# Patient Record
Sex: Male | Born: 1995 | Race: White | Hispanic: No | Marital: Single | Smoking: Current every day smoker
Health system: Southern US, Community
[De-identification: ages and names within clinical notes are randomized; demographics above are authoritative.]

---

## 2020-12-16 ENCOUNTER — Encounter: Payer: Self-pay | Admitting: Internal Medicine

## 2020-12-16 ENCOUNTER — Other Ambulatory Visit: Payer: Self-pay

## 2020-12-16 ENCOUNTER — Ambulatory Visit (INDEPENDENT_AMBULATORY_CARE_PROVIDER_SITE_OTHER): Payer: BC Managed Care – PPO | Admitting: Internal Medicine

## 2020-12-16 VITALS — BP 143/82 | HR 82 | Temp 98.0°F | Ht 73.0 in | Wt 158.0 lb

## 2020-12-16 DIAGNOSIS — Z23 Encounter for immunization: Secondary | ICD-10-CM | POA: Diagnosis not present

## 2020-12-16 DIAGNOSIS — B182 Chronic viral hepatitis C: Secondary | ICD-10-CM | POA: Diagnosis not present

## 2020-12-16 NOTE — Progress Notes (Signed)
[facility]  Reason for Consult: chronic hep C Referring Provider: Elray Buba, MD from [facility]    There are no problems to display for this patient.     HPI: Roger Robinson is a 25 y.o. 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 He never was dx'ed or told about hep c prior to 11/2020 He never drinks alcohol He is doing well with current rehab program; he is not sure how long he'll take vivitrol for  Social: Patient lives alone; no relatives in [place] Patient will get back to work in a few work. Patient is a Teaching laboratory technician No etoh use Smokes cigarette; 4 years 1/2 ppd He endorses vaping  No marijuana   Cirrhosis/extraGI manifestation Besides the episode of yellowing skin/flu like illness 02/2020 he had felt well otherwise No hx abd ascites/distension or need of paracentesis; no chronic fatigue; weight loss; hx GIB No recurrent rash, joint pain No admission kidney/lung disease otherwise  Meds: He just finished 14 days augmentin for a right face abscess Atomoxetine escitalopram Ibuprofen prn Trazodone vivitrol injection q4weeks  problems No other medical problem otherwise No hx reflux/heartburn. No consumption of any otc acid blocker  Currently he is at healthy baseline health. No f/c/weight loss, nightsweat. No n/v/diarrhea. Good appetite  Review of Systems: ROS Other ros negative     No past medical history on file.  Social History   Tobacco Use  .  Smoking status: Current Every Day Smoker    Packs/day: 0.50    Years: 4.00    Pack years: 2.00    Types: Cigarettes  . Smokeless tobacco: Never Used  Vaping Use  . Vaping Use: Every day  Substance Use Topics  . Alcohol use: Not Currently  . Drug use: Not Currently    Types: IV, Cocaine, Marijuana, "Crack" cocaine, Fentanyl    Comment: last use in Nov 2021    fam hx: Unclear if any hepatitis present    OBJECTIVE: Vitals:   12/16/20 1022  BP: (!) 143/82  Pulse: 82  Temp: 98 F (36.7 C)  Weight: 158 lb (71.7 kg)  Height: 6\' 1"  (1.854 m)   Body mass index is 20.85 kg/m.   Physical Exam Well appearing, conversant, no distress Heent: atraumatic; right lower facial cellulitis/abscess per patient had resolved; oropahrynx clear Neck supple cv rrr no mrg Lungs clear abd s/nt Ext no edema Skin no rash; multiple tatoos Neuro: cn2-12 intact; strength/reflex intact; nonfocal otherwise Psych alert/oriented msk no cva/back tenderness  Lab: osh 11/13/20 Lipid 213/49/145/89 Cbc 7.7/14.9/307 Cr 0.8; lft 74/191/88/0.6; alb 4.9  Microbiology:  Serology: osh 11/13/20 rpr nr, urine gc/chlam negative hiv serology nr; rpr nr; hep c ab reactive; hep a ab nonreactive  Imaging:   Assessment/plan: #chronic hep c  Prior treatment: none GT: unknown at this time Evidence of cirrhosis: no Interested in treatment  Potential DDI: none  Will need to ascertain chronicity of hep c and active infection   -  Labs: hep c genotype/pcr -Imaging: elastography [place] ordered -follow up: 4 weeks when results available -Meds planned: mavyret of 8 weeks; no ddi  -discussed natural progression of hep c, transmission (avoid sharing personal hygiene equipment) -discussed avoid toxin like etoh and excessive acetamaminphen (no more than 2 gram a day) -discussed healthy life style and good glucose control -discussed avoiding eating raw sea food -discussed we can treat hep c but can be  reinfected -discussed hepatitis coinfection and vaccination  #other hepatitis Patient interested in hepatitis b vaccine as well. I think this is reasonable -start hep b vaccine series today 12/16/20  ---- Addendum We only heplisav here and separate hep a vaccine --> will give hep b vaccine only then  Problem List Items Addressed This Visit   None   Visit Diagnoses    Chronic hepatitis C without hepatic coma (HCC)    -  Primary   Relevant Orders   Hepatitis C RNA quantitative   Hepatitis C genotype   Protime-INR   US ABDOMEN COMPLETE W/ELASTOGRAPHY       I am having Zack Seal maintain his traZODone, escitalopram, atomoxetine, amoxicillin-clavulanate, and ibuprofen.     Follow-up: No follow-ups on file.  Raymondo Band, MD [facility] -- -- pager   [phone] cell 12/16/2020, 10:35 AM

## 2020-12-16 NOTE — Patient Instructions (Addendum)
You likely have chronic hep c  As 1 in 4 people clears hep c infection, will test for its active presence today  Will also order a liver ultrasound to help determine length of treatment  Once these are available, please see me 2 weeks after they are done to discuss treatment  Will also start hep a/b vaccination today. You can come in for nurse administration of these shots, and not needing to see me.

## 2020-12-20 LAB — HEPATITIS C RNA QUANTITATIVE
HCV Quantitative Log: 6.06 log IU/mL — ABNORMAL HIGH
HCV RNA, PCR, QN: 1150000 IU/mL — ABNORMAL HIGH

## 2020-12-20 LAB — PROTIME-INR
INR: 1
Prothrombin Time: 10.3 s (ref 9.0–11.5)

## 2020-12-20 LAB — HEPATITIS C GENOTYPE: HCV Genotype: 3

## 2020-12-22 ENCOUNTER — Ambulatory Visit (HOSPITAL_COMMUNITY): Payer: BC Managed Care – PPO

## 2020-12-26 ENCOUNTER — Ambulatory Visit (HOSPITAL_COMMUNITY): Payer: BC Managed Care – PPO

## 2021-01-16 ENCOUNTER — Emergency Department (HOSPITAL_COMMUNITY): Payer: BC Managed Care – PPO

## 2021-01-16 ENCOUNTER — Emergency Department (HOSPITAL_COMMUNITY)
Admission: EM | Admit: 2021-01-16 | Discharge: 2021-01-19 | Disposition: A | Discharge status: Home / Self Care | Payer: BC Managed Care – PPO | Attending: Emergency Medicine | Admitting: Emergency Medicine

## 2021-01-16 ENCOUNTER — Other Ambulatory Visit: Payer: Self-pay

## 2021-01-16 ENCOUNTER — Encounter (HOSPITAL_COMMUNITY): Payer: Self-pay | Admitting: Student

## 2021-01-16 DIAGNOSIS — W228XXA Striking against or struck by other objects, initial encounter: Secondary | ICD-10-CM | POA: Insufficient documentation

## 2021-01-16 DIAGNOSIS — Z20822 Contact with and (suspected) exposure to covid-19: Secondary | ICD-10-CM | POA: Insufficient documentation

## 2021-01-16 DIAGNOSIS — T50902A Poisoning by unspecified drugs, medicaments and biological substances, intentional self-harm, initial encounter: Secondary | ICD-10-CM | POA: Diagnosis not present

## 2021-01-16 DIAGNOSIS — R42 Dizziness and giddiness: Secondary | ICD-10-CM | POA: Diagnosis not present

## 2021-01-16 DIAGNOSIS — F339 Major depressive disorder, recurrent, unspecified: Secondary | ICD-10-CM | POA: Diagnosis not present

## 2021-01-16 DIAGNOSIS — R000 Tachycardia, unspecified: Secondary | ICD-10-CM | POA: Diagnosis not present

## 2021-01-16 DIAGNOSIS — R45851 Suicidal ideations: Secondary | ICD-10-CM | POA: Insufficient documentation

## 2021-01-16 DIAGNOSIS — F17210 Nicotine dependence, cigarettes, uncomplicated: Secondary | ICD-10-CM | POA: Diagnosis not present

## 2021-01-16 DIAGNOSIS — Z046 Encounter for general psychiatric examination, requested by authority: Secondary | ICD-10-CM | POA: Diagnosis present

## 2021-01-16 LAB — CBC
HCT: 45.8 % (ref 39.0–52.0)
Hemoglobin: 15.1 g/dL (ref 13.0–17.0)
MCH: 29 pg (ref 26.0–34.0)
MCHC: 33 g/dL (ref 30.0–36.0)
MCV: 88.1 fL (ref 80.0–100.0)
Platelets: 333 10*3/uL (ref 150–400)
RBC: 5.2 MIL/uL (ref 4.22–5.81)
RDW: 12.8 % (ref 11.5–15.5)
WBC: 9.7 10*3/uL (ref 4.0–10.5)
nRBC: 0 % (ref 0.0–0.2)

## 2021-01-16 LAB — COMPREHENSIVE METABOLIC PANEL
ALT: 32 U/L (ref 0–44)
AST: 31 U/L (ref 15–41)
Albumin: 4.3 g/dL (ref 3.5–5.0)
Alkaline Phosphatase: 66 U/L (ref 38–126)
Anion gap: 12 (ref 5–15)
BUN: 9 mg/dL (ref 6–20)
CO2: 25 mmol/L (ref 22–32)
Calcium: 9.9 mg/dL (ref 8.9–10.3)
Chloride: 103 mmol/L (ref 98–111)
Creatinine, Ser: 0.98 mg/dL (ref 0.61–1.24)
GFR, Estimated: >60 mL/min (ref >60)
Glucose, Bld: 90 mg/dL (ref 70–99)
Potassium: 3.4 mmol/L — ABNORMAL LOW (ref 3.5–5.1)
Sodium: 140 mmol/L (ref 135–145)
Total Bilirubin: 0.4 mg/dL (ref 0.3–1.2)
Total Protein: 7.3 g/dL (ref 6.5–8.1)

## 2021-01-16 LAB — SALICYLATE LEVEL: Salicylate Lvl: <7 mg/dL — ABNORMAL LOW (ref 7.0–30.0)

## 2021-01-16 LAB — ACETAMINOPHEN LEVEL: Acetaminophen (Tylenol), Serum: <10 ug/mL — ABNORMAL LOW (ref 10–30)

## 2021-01-16 LAB — MAGNESIUM: Magnesium: 1.9 mg/dL (ref 1.7–2.4)

## 2021-01-16 LAB — ETHANOL: Alcohol, Ethyl (B): <10 mg/dL (ref <10)

## 2021-01-16 MED ORDER — POTASSIUM CHLORIDE CRYS ER 20 MEQ PO TBCR
40.0000 meq | EXTENDED_RELEASE_TABLET | Freq: Once | ORAL | Status: AC
  Administered 2021-01-16: 40 meq via ORAL
  Filled 2021-01-16: qty 2

## 2021-01-16 MED ORDER — SODIUM CHLORIDE 0.9 % IV BOLUS
1000.0000 mL | Freq: Once | INTRAVENOUS | Status: AC
  Administered 2021-01-16: 1000 mL via INTRAVENOUS

## 2021-01-16 NOTE — ED Provider Notes (Signed)
[facility] EMERGENCY DEPARTMENT Provider Note   CSN: [id number] Arrival date & time: 01/16/21  2134     History Chief Complaint  Patient presents with  . Suicide Attempt    Roger Robinson is a 25 y.o. male with a hx of tobacco & methamphetamine use who [facility] of attempted overdose & near syncope. Patient states he has had increased anxiety/jitteriness today, he has been feeling depressed, thoughts of suicide. Today he states that he took approximately 20-25 tablets of Unisom (25 mg tablets) around 20:30. He denies this being a suicide attempt but was stress induced. He noted met use at 1900 to triage, denied this to me. Approximately 10-15 minutes following ingestion he felt lightheaded and fell backwards hitting his head, no LOC. No chest pain/dyspnea. No pain at present. States he still feels a little jittery/anxious. Denies HI or hallucinations.    HPI     History reviewed. No pertinent past medical history.  There are no problems to display for this patient.   History reviewed. No pertinent surgical history.     History reviewed. No pertinent family history.  Social History   Tobacco Use  . Smoking status: Current Every Day Smoker    Packs/day: 0.50    Years: 4.00    Pack years: 2.00    Types: Cigarettes  . Smokeless tobacco: Never Used  Vaping Use  . Vaping Use: Every day  Substance Use Topics  . Alcohol use: Not Currently  . Drug use: Not Currently    Types: IV, Cocaine, Marijuana, "Crack" cocaine, Fentanyl    Comment: last use in Nov 2021    Home Medications Prior to Admission medications   Medication Sig Start Date End Date Taking? Authorizing Provider  atomoxetine (STRATTERA) 60 MG capsule Take 60 mg by mouth every morning. 12/11/20   [provider]  escitalopram (LEXAPRO) 10 MG tablet Take 5 mg by mouth daily. 12/11/20   [provider]  ibuprofen (ADVIL) 600 MG tablet  Take 600 mg by mouth every 6 (six) hours as needed.    [provider]  traZODone (DESYREL) 50 MG tablet Take 50 mg by mouth as needed. 12/11/20   [provider]    Allergies    Patient has no known allergies.  Review of Systems   Review of Systems  Constitutional: Negative for fever.  Eyes: Negative for visual disturbance.  Respiratory: Negative for shortness of breath.   Cardiovascular: Negative for chest pain.  Gastrointestinal: Negative for abdominal pain and vomiting.  Neurological: Positive for syncope (near, no full LOC) and light-headedness.  Psychiatric/Behavioral: Positive for suicidal ideas. Negative for hallucinations. The patient is nervous/anxious.   All other systems reviewed and are negative.   Physical Exam Updated Vital Signs BP 137/78   Pulse (!) 120   Temp 98.1 F (36.7 C) (Oral)   Resp 18   SpO2 97%   Physical Exam Vitals and nursing note reviewed.  Constitutional:      General: He is not in acute distress.    Appearance: He is well-developed. He is not toxic-appearing.  HENT:     Head: Normocephalic and atraumatic.     Comments: No racoon eyes or battle sign.     Ears:     Comments: No hemotympanum.  Eyes:     General:        Right eye: No discharge.        Left eye: No discharge.  Extraocular Movements: Extraocular movements intact.     Conjunctiva/sclera: Conjunctivae normal.     Pupils: Pupils are equal, round, and reactive to light.     Comments: Pupils mildly dilated but reactive.   Cardiovascular:     Rate and Rhythm: Regular rhythm. Tachycardia present.     Comments: 2+ symmetric radial/DP pulses.  Pulmonary:     Effort: Pulmonary effort is normal. No respiratory distress.     Breath sounds: Normal breath sounds. No wheezing, rhonchi or rales.  Chest:     Chest wall: No tenderness.  Abdominal:     General: There is no distension.     Palpations: Abdomen is soft.     Tenderness: There is no abdominal tenderness.  There is no guarding or rebound.  Musculoskeletal:     Cervical back: Normal range of motion and neck supple. No tenderness.     Comments: UE/LEs: No significant obvious deformities. Actively moving at all joints x 4. No point/focal bony tenderness.  Back: No midline tenderness.   Skin:    General: Skin is warm and dry.     Findings: No rash.  Neurological:     Mental Status: He is alert.     Comments: Clear speech. CN III-XII grossly intact. Sensation grossly intact to bilateral upper/lower extremities. 5/5 symmetric grip strength & strength with plantar/dorsiflexion bilaterally.   Psychiatric:        Thought Content: Thought content includes suicidal ideation.     ED Results / Procedures / Treatments   Labs (all labs ordered are listed, but only abnormal results are displayed) Labs Reviewed  COMPREHENSIVE METABOLIC PANEL - Abnormal; Notable for the following components:      Result Value   Potassium 3.4 (*)    All other components within normal limits  SALICYLATE LEVEL - Abnormal; Notable for the following components:   Salicylate Lvl <2.0 (*)    All other components within normal limits  ACETAMINOPHEN LEVEL - Abnormal; Notable for the following components:   Acetaminophen (Tylenol), Serum <10 (*)    All other components within normal limits  ETHANOL  CBC  RAPID URINE DRUG SCREEN, HOSP PERFORMED    EKG EKG Interpretation  Date/Time:  Friday January 16 2021 23:42:16 EST Ventricular Rate:  102 PR Interval:  164 QRS Duration: 102 QT Interval:  394 QTC Calculation: 513 R Axis:   95 Text Interpretation: Sinus tachycardia Possible Left atrial enlargement Rightward axis Borderline ECG Confirmed by Veryl Speak [phone]) on 01/16/2021 11:57:09 PM   Radiology No results found.  Procedures Procedures   Medications Ordered in ED Medications - No data to display  ED Course  I have reviewed the triage vital signs and the nursing notes.  Pertinent labs & imaging results  that were available during my care of the patient were reviewed by me and considered in my medical decision making (see chart for details).    MDM Rules/Calculators/A&P                         Patient presents to the ED status post overdose on Unisom.  Patient is nontoxic, his vitals are notable for initial tachycardia. No obvious signs of trauma on exam in relation to his fall. I discussed the case with poison control, they have recommended observing for anticholinergic side effects, recommend fluids and benzodiazepines for tachycardia or potential seizures, look for prolongation of the QRS/QTc on EKG, will need 6 hours of observation from time of ingestion.  Additional history obtained:  Additional history obtained from chart review & nursing note review.   Initial EKG with prolonged QTC, QRS is within normal limits.  Lab Tests:  I Ordered, reviewed, and interpreted labs, which included:  CBC, CMP, magnesium, ethanol level, acetaminophen level, salicylate level, UDS, Covid testing: Fairly unremarkable, mildly hypokalemic at 3.4.  Imaging Studies ordered:  CT head obtained, I independently reviewed, formal radiology impression shows: no acute process.   ED Course:  Given patient's somewhat prolonged QTC he was given IV potassium and magnesium as well as oral potassium which he tolerated without difficulty.  His repeat EKG shows a QTC of less than 500.  He is symptomatically feeling well, no current complaints, his heart rate has normalized.  I rediscussed the case with poison control at 4AM-patient can be medically cleared.  Patient is medically cleared at this time.  Consult placed to behavioral health for further assessment.  Disposition per [facility].   Portions of this note were generated with Lobbyist. Dictation errors may occur despite best attempts at proofreading.   Final Clinical Impression(s) / ED Diagnoses Final diagnoses:  Intentional drug overdose, initial  encounter [facility])    Rx / DC Orders ED Discharge Orders    None       Amaryllis Dyke, PA-C 01/17/21 7703    Veryl Speak, MD 01/17/21 [phone]

## 2021-01-16 NOTE — ED Notes (Signed)
Patient transported to CT scan . 

## 2021-01-16 NOTE — ED Notes (Signed)
Personal belongings inventoried and stored at locker#3 at [facility].

## 2021-01-16 NOTE — ED Notes (Signed)
RN from [facility] called and said that if pt's UDS is positive he cannot return to facility

## 2021-01-16 NOTE — ED Triage Notes (Addendum)
Pt presents to ED [facility] from rehab. Pt had fall today after feeling dizzy. Fall was aprox 10-37m after attempted suicide and took 35 - 25mg  unisom around 2030, and meth use at 73. Abrasion to back of head, no LOC, no blood thinners. Ems  -  134/84 HR - 116 96% RA

## 2021-01-17 LAB — RAPID URINE DRUG SCREEN, HOSP PERFORMED
Amphetamines: NOT DETECTED
Barbiturates: NOT DETECTED
Benzodiazepines: NOT DETECTED
Cocaine: NOT DETECTED
Opiates: NOT DETECTED
Tetrahydrocannabinol: NOT DETECTED

## 2021-01-17 LAB — RESP PANEL BY RT-PCR (FLU A&B, COVID) ARPGX2
Influenza A by PCR: NEGATIVE
Influenza B by PCR: NEGATIVE
SARS Coronavirus 2 by RT PCR: NEGATIVE

## 2021-01-17 MED ORDER — ALUM & MAG HYDROXIDE-SIMETH 200-200-20 MG/5ML PO SUSP: 30.0000 mL | Freq: Four times a day (QID) | ORAL | Status: DC | PRN

## 2021-01-17 MED ORDER — ZOLPIDEM TARTRATE 5 MG PO TABS: 5.0000 mg | ORAL_TABLET | Freq: Every evening | ORAL | Status: DC | PRN

## 2021-01-17 MED ORDER — NICOTINE 21 MG/24HR TD PT24
21.0000 mg | MEDICATED_PATCH | Freq: Every day | TRANSDERMAL | Status: DC
Start: 2021-01-17 — End: 2021-01-19

## 2021-01-17 MED ORDER — MAGNESIUM SULFATE IN D5W 1-5 GM/100ML-% IV SOLN
1.0000 g | Freq: Once | INTRAVENOUS | Status: AC
  Administered 2021-01-17: 1 g via INTRAVENOUS
  Filled 2021-01-17: qty 100

## 2021-01-17 MED ORDER — POTASSIUM CHLORIDE 10 MEQ/100ML IV SOLN
10.0000 meq | Freq: Once | INTRAVENOUS | Status: AC
  Administered 2021-01-17: 10 meq via INTRAVENOUS
  Filled 2021-01-17: qty 100

## 2021-01-17 MED ORDER — ACETAMINOPHEN 325 MG PO TABS: 650.0000 mg | ORAL_TABLET | ORAL | Status: DC | PRN

## 2021-01-17 NOTE — BH Assessment (Signed)
TTS spoke with Nurse Secretary, to notify RN to put pt in a private room to complete TTS assessment.  Clinician to call the cart.

## 2021-01-17 NOTE — ED Provider Notes (Signed)
Patient recommended for inpatient admission according to behavioral health.  Patient uncooperative with this and placed under IVC for his safety   Lorre Nick, MD 01/17/21 1147

## 2021-01-17 NOTE — ED Notes (Signed)
TTS video interview in progress .  

## 2021-01-17 NOTE — ED Notes (Signed)
Pt awakened asking this RN "why am I still here". This RN explained to pt that [facility] has recommended inpatient treatment due to most recent suicide attempt. Pt states, "I already have inpatient treatment, and I'm leaving, just watch me I'll sneak away when your not looking". Instructed pt back to bed. Dr. Freida Busman notified. IVC paperwork being drawn. Pt now in bed resting.

## 2021-01-17 NOTE — Progress Notes (Signed)
Per Melbourne Abts PA-C, patient meets criteria for inpatient treatment. There are no available beds at [facility] today. CSW faxed referrals to the following facilities for review:  [facility] [place]  TTS will continue to seek bed placement.   Trula Slade, MSW, LCSW Clinical Social Worker 01/17/2021 11:26 AM

## 2021-01-17 NOTE — BH Assessment (Signed)
Comprehensive Clinical Assessment (CCA) Note  01/17/2021 Roger Robinson [id number]  Chief Complaint:  Chief Complaint  Patient presents with  . Suicide Attempt   Visit Diagnosis:  F33.9 Major depressive disorder, Recurrent episode, Unspecified  Roger Robinson is a 25 years old single male who presents voluntarily to [facility] ED via EMS and accompanied by his mother, Roger Robinson [phone], TTS counselor unable to contact, due to mailbox full.    Pt reported to EMS ' fall was aprox 1--23m after attempted suicide and took 25-25mg . Pt reports that he have been experiencing panic attacks, "I have had five, and they come out of no where".  Pt report that he woke up and couldn't move his legs, fell to the ground.  Clinician asked patient what caused the fall.   Pt denies suicide ideation; P admitted that he had thoughts of past suicide in January 2020 didn't explained means or plan.  Pt denied homicidal ideation or history of violence.  Pt denies history of auditory or visual hallucinations.  Pt admits to brief paranoia, would not go in detail.  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  Pt identifies his primary stressors as people and places.  Pt reports that he is working and he has four children.  Pt reports that he currently living with his mother.  Pt reports that his father and brother has a history of substance use and mental illness  Pt denies any history of abuse or trauma.  Pt reports that he have a two court dates scheduled for February and March 2022.  Pt says he is currently receiving weekly outpatient therapy from [facility] at [facility].  Pt reported that he meets with a Brooks Sailors, in [facility] for therapy.  Pt reports he takes medications as prescribe and denies previous inpatient psychiatric hospitalization.  Pt is dressed in scrubs, alert, oriented x 3 with agitated speech and restless motor behavior.  Eye  contact is starring.  Pt mood is agitated, and affect is anxious.  Thought process is confused and scatter.  Pt insight is shallow and judgement is impaired.  There is no indication Pt is currently responding to internal stimuli or experiencing delusional thought content.  Pt was guarded throughout assessment.                    Deposition Melbourne Abts PA-C, Pt meets inpatient criteria, no beds available at [facility].  Recommends observation and to be reassessed by psychiatry, also recommends IVC.  Dispositon Social Worker will secure placement in the AM.  Disposition was discussed with [name], Charity fundraiser.    CCA Screening, Triage and Referral (STR)  Patient Reported Information How did you hear about [place]? No data recorded Referral name: No data recorded Referral phone number: No data recorded  Whom do you see for routine medical problems? I don't have a doctor  Practice/Facility Name: No data recorded Practice/Facility Phone Number: No data recorded Name of Contact: No data recorded Contact Number: No data recorded Contact Fax Number: No data recorded Prescriber Name: No data recorded Prescriber Address (if known): No data recorded  What Is the Reason for Your Visit/Call Today? Substance Induce  How Long Has This Been Causing You Problems? <Week  What Do You Feel Would Help You the Most Today? -- (UTA)   Have You Recently Been in Any Inpatient Treatment (Hospital/Detox/Crisis Center/28-Day Program)? Yes  Name[facility]Location of [facility]/Hospital:[facility]  How Long Were You There? November 10, 2020  When Were You Discharged? 01/17/2021   Have You Ever Received Services From [facility] Before? No  Who Do You See at [facility]? No data recorded  Have You Recently Had Any Thoughts About Hurting Yourself? No  Are You Planning to Commit Suicide/Harm Yourself At This time? No (Pt reports that he thought of suicide ideation in 2020, refused to share mean and plan.)   Have  you Recently Had Thoughts About Hurting Someone Karolee Ohs? No  Explanation: No data recorded  Have You Used Any Alcohol or Drugs in the Past 24 Hours? No (Pt denied using substance.)  How Long Ago Did You Use Drugs or Alcohol? No data recorded What Did You Use and How Much? No data recorded  Do You Currently Have a Therapist/Psychiatrist? Yes  Name of Therapist/Psychiatrist: [facility] that [facility], [place]   Have You Been Recently Discharged From Any Office Practice or Programs? Yes  Explanation of Discharge From Practice/Program: Fall was aprox 10-30m after attempted suicide and took 59 - 25mg  unisom around 2030, and meth use at 41.     CCA Screening Triage Referral Assessment Type of Contact: Tele-Assessment  Is this Initial or Reassessment? Initial Assessment  Date Telepsych consult ordered in [facility]:  01/17/2021  Time Telepsych consult ordered in [facility]:  No data recorded  Patient Reported Information Reviewed? Yes  Patient Left Without Being Seen? No data recorded Reason for Not Completing Assessment: No data recorded  Collateral Involvement: 03/17/2021 [phone], unable to speak  or leave a hippa voice mail, her voice mail full.   Does Patient Have a [phone] Guardian? No data recorded Name and Contact of Legal Guardian: No data recorded If Minor and Not Living with Parent(s), Who has Custody? n/a  Is CPS involved or ever been involved? Never  Is APS involved or ever been involved? No data recorded  Patient Determined To Be At Risk for Harm To Self or Others Based on Review of Patient Reported Information or Presenting Complaint? Yes, for Self-Harm (Pt denies self harm)  Method: No data recorded Availability of Means: No data recorded Intent: No data recorded Notification Required: No data recorded Additional Information for Danger to Others Potential: No data recorded Additional Comments for Danger to Others Potential: No data  recorded Are There Guns or Other Weapons in Your Home? No data recorded Types of Guns/Weapons: No data recorded Are These Weapons Safely Secured?                            No data recorded Who Could Verify You Are Able To Have These Secured: No data recorded Do You Have any Outstanding Charges, Pending Court Dates, Parole/Probation? No data recorded Contacted To Inform of Risk of Harm To Self or Others: Law Enforcement   Location of Assessment: [facility] ED   Does Patient Present under Involuntary Commitment? No  IVC Papers Initial File Date: No data recorded  [facility] of Residence: [place]   Patient Currently Receiving the Following Services: IOP (Intensive Outpatient Program)   Determination of Need: No data recorded  Options For Referral: -- (UTA)     CCA Biopsychosocial Intake/Chief Complaint:  SI  Current Symptoms/Problems: irritable, fatigue,   Patient Reported Schizophrenia/Schizoaffective Diagnosis in Past: No   Strengths: UTA  Preferences: UTA  Abilities: UTA   Type of Services Patient Feels are Needed: UTA   Initial  Clinical Notes/Concerns: UTA   Mental Health Symptoms Depression:  No data recorded  Duration of Depressive symptoms: Greater than two weeks   Mania:  Change in energy/activity; Irritability; Recklessness   Anxiety:   Fatigue; Irritability; Restlessness   Psychosis:  None   Duration of Psychotic symptoms: No data recorded  Trauma:  None   Obsessions:  Disrupts routine/functioning; Poor insight; Recurrent & persistent thoughts/impulses/images   Compulsions:  Repeated behaviors/mental acts; Disrupts with routine/functioning; Poor Insight   Inattention:  Does not seem to listen   Hyperactivity/Impulsivity:  N/A   Oppositional/Defiant Behaviors:  Angry; Argumentative; Defies rules; Easily annoyed   Emotional Irregularity:  Transient, stress-related paranoia/disassociation   Other Mood/Personality Symptoms:  UTA    Mental Status  Exam Appearance and self-care  Stature:  Small   Weight:  Average weight   Clothing:  -- (Pt dressed in scrubs)   Grooming:  Normal   Cosmetic use:  Age appropriate   Posture/gait:  Tense   Motor activity:  Agitated   Sensorium  Attention:  Distractible; Confused   Concentration:  Preoccupied; Scattered   Orientation:  Person; Place; Situation   Recall/memory:  Defective in Immediate; Defective in Short-term; Defective in Recent   Affect and Mood  Affect:  Restricted   Mood:  Angry   Relating  Eye contact:  Staring   Facial expression:  Angry   Attitude toward examiner:  Guarded   Thought and Language  Speech flow: Slurred   Thought content:  Suspicious   Preoccupation:  None   Hallucinations:  None   Organization:  No data recorded  Affiliated Computer ServicesExecutive Functions  Fund of Knowledge:  Fair   Intelligence:  Needs investigation   Abstraction:  -- Industrial/product designer(UTA)   Judgement:  Impaired   Reality Testing:  Distorted   Insight:  Shallow   Decision Making:  Impulsive; Confused   Social Functioning  Social Maturity:  Self-centered   Social Judgement:  "Chief of Stafftreet Smart"   Stress  Stressors:  Family conflict; Relationship   Coping Ability:  Contractorxhausted   Skill Deficits:  No data recorded  Supports:  Friends/Service system     Religion: Religion/Spirituality Are You A Religious Person?:  (U'TA) How Might This Affect Treatment?: UTA  Leisure/Recreation: Leisure / Recreation Do You Have Hobbies?:  (UTA)  Exercise/Diet: Exercise/Diet Do You Exercise?:  (UTA) Have You Gained or Lost A Significant Amount of Weight in the Past Six Months?:  (UTA) Do You Follow a Special Diet?:  (Pt reports that he eat well and balance meals.) Do You Have Any Trouble Sleeping?:  (Pt reports that he obtain 6 .5 sleep during the night.)   CCA Employment/Education Employment/Work Situation: Employment / Work Environmental consultantituation Patient's job has been impacted by current illness: No What is  the longest time patient has a held a job?: UTA Where was the patient employed at that time?: [employer] Has patient ever been in the Eli Lilly and Companymilitary?:  Industrial/product designer(UTA)  Education: Education Is Patient Currently Attending School?: Yes School Currently Attending: none Last Grade Completed:  (Pt reports that he did not graduate) Name of High School: UTA Did Garment/textile technologistYou Graduate From [employer] School?:  (UTA) Did You Attend College?:  (UTA) Did You Attend Graduate School?:  (UTA) Did You Have Any Special Interests In School?: UTA Did You Have An Individualized Education Program (IIEP):  (UTA) Did You Have Any Difficulty At School?:  (UTA) Patient's Education Has Been Impacted by Current Illness:  (UTA)   CCA Family/Childhood History Family and Relationship History: Family  history What is your sexual orientation?: UTA Has your sexual activity been affected by drugs, alcohol, medication, or emotional stress?: UTA Does patient have children?: Yes How many children?: 4 How is patient's relationship with their children?: UTA  Childhood History:     Child/Adolescent Assessment:     CCA Substance Use Alcohol/Drug Use: Alcohol / Drug Use Pain Medications: See MRA Prescriptions: See MRA Over the Counter: See MRA History of alcohol / drug use?: Yes Substance #1 Name of Substance 1: Methamphetamines 1 - Age of First Use: 14 1 - Amount (size/oz): 3.5 gram 1 - Frequency: daily 1 - Duration: ongoing 1 - Last Use / Amount: 11/10/2020 1 - Method of Aquiring: UTA 1- Route of Use: UTA                       ASAM's:  Six Dimensions of Multidimensional Assessment  Dimension 1:  Acute Intoxication and/or Withdrawal Potential:      Dimension 2:  Biomedical Conditions and Complications:      Dimension 3:  Emotional, Behavioral, or Cognitive Conditions and Complications:     Dimension 4:  Readiness to Change:     Dimension 5:  Relapse, Continued use, or Continued Problem Potential:     Dimension 6:   Recovery/Living Environment:     ASAM Severity Score:    ASAM Recommended Level of Treatment:     Substance use Disorder (SUD)    Recommendations for Services/Supports/Treatments:    DSM5 Diagnoses: There are no problems to display for this patient.      Referrals to Alternative Service(s): Referred to Alternative Service(s):   Place:   Date:   Time:    Referred to Alternative Service(s):   Place:   Date:   Time:    Referred to Alternative Service(s):   Place:   Date:   Time:    Referred to Alternative Service(s):   Place:   Date:   Time:     Meryle Ready, Counselor

## 2021-01-17 NOTE — ED Notes (Signed)
Belongings inventoried in locker #3-Monique,RN

## 2021-01-18 NOTE — ED Notes (Signed)
Pt ambulated to restroom and back to room. Breakfast at patients bedside.

## 2021-01-18 NOTE — ED Notes (Signed)
Dinner tray delivered.

## 2021-01-18 NOTE — ED Notes (Signed)
Breakfast Ordered 

## 2021-01-18 NOTE — BHH Counselor (Signed)
Reassessment  01/18/21: TTS reassessed patient. When TTS asked patient why he was in the hospital, patient reports he took 1 too many sleeping pills and had no intention of harming himself. Patient reports he lives with his mom and has a history of depression. Patient reports he is compliant with his psych medication, Lexapro. Patient denied SI/HI and any previous attempts. Patient denied AVH. Patient gave TTS permission to speak with his mother, [name].   Collateral: Lisa Hunt(mom)- [phone]: TTS spoke with mom. Mom reports patient had argument with his girl-friend. Patient sent mom a text saying that he did not want to be here anymore. Mom states patient was in rehab and had been clean for 60 days. Mom confirmed history of depression but also reported SA with Methamphetamines and heroin. Mom reports she believed this was a suicide attempt because patient was aware of the current medications that he had already taken.   Disposition: Ginger Organ, NP recommends to continue inpatient treatment.

## 2021-01-18 NOTE — ED Provider Notes (Signed)
Emergency Medicine Observation Re-evaluation Note  Roger Robinson is a 25 y.o. male, seen on rounds today.  Pt initially presented to the ED for complaints of Suicide Attempt Currently, the patient is resting.  Physical Exam  BP 129/73 (BP Location: Left Arm)   Pulse 91   Temp 98.2 F (36.8 C) (Oral)   Resp 17   SpO2 98%  Physical Exam General: calm   Cardiac: warm and well perfused Lungs: even and unlabored Psych: calm  ED Course / MDM  EKG:EKG Interpretation  Date/Time:  Friday January 16 2021 23:42:16 EST Ventricular Rate:  102 PR Interval:  164 QRS Duration: 102 QT Interval:  394 QTC Calculation: 513 R Axis:   95 Text Interpretation: Sinus tachycardia Possible Left atrial enlargement Rightward axis Borderline ECG Confirmed by Geoffery Lyons ([id number]) on 01/16/2021 11:57:09 PM    I have reviewed the labs performed to date as well as medications administered while in observation.  Recent changes in the last 24 hours include IVC per Bruce Donath.  Plan  Current plan is for in patient admission. Patient is under full IVC at this time.   Milagros Loll, MD 01/18/21 1550

## 2021-01-18 NOTE — ED Notes (Signed)
Pt ate all the food provided on his tray. Appetite is good & no complaints from the pt.

## 2021-01-18 NOTE — ED Notes (Signed)
Lunch tray delivered.

## 2021-01-19 LAB — BASIC METABOLIC PANEL
Anion gap: 13 (ref 5–15)
BUN: 18 mg/dL (ref 6–20)
CO2: 23 mmol/L (ref 22–32)
Calcium: 9.3 mg/dL (ref 8.9–10.3)
Chloride: 103 mmol/L (ref 98–111)
Creatinine, Ser: 0.88 mg/dL (ref 0.61–1.24)
GFR, Estimated: >60 mL/min (ref >60)
Glucose, Bld: 177 mg/dL — ABNORMAL HIGH (ref 70–99)
Potassium: 3.6 mmol/L (ref 3.5–5.1)
Sodium: 139 mmol/L (ref 135–145)

## 2021-01-19 NOTE — Progress Notes (Signed)
Patient seen by me as well as TTS counselor Murrell Redden. Patient presented to [facility] on 01/16/21 after overdose on Unisom. Patient reports he had been trying to go to sleep, and when he woke up not feeling right, he called for help. He states understanding that this was a poor choice and says he will not overtake medication again. He denies any SI/HI/AVH. Patient was previously at [facility] for rehab but states he will be discharging home with his mother. TTS counselor Theodoro Grist called the patient's mother for collateral information, and patient's mother denies safety concerns for discharge. I have requested CSW to place outpatient substance use referrals in discharge paperwork. Patient is psych cleared for discharge.

## 2021-01-19 NOTE — Progress Notes (Signed)
Pt has been psychiatrically cleared per Marciano Sequin, NP. Outpatient resources have been placed in pt's discharge summary.     Wells Guiles, MSW, LCSW, LCAS Clinical Social Worker II Disposition CSW [phone]

## 2021-01-19 NOTE — Discharge Instructions (Signed)
Please follow up with one of the following outpatient providers:  [facility] [address] [place], [place] [address] [phone] Private insurance, Medicare A&B, and [facility]  ADS (Alcohol and Drug Services) [address] [place], [place] [address] [phone] Medicaid, Self Pay  [facility] [address] [place], [place] [phone] Medicaid and [facility], Self Pay  [facility] [address] [place], [place] [phone] [facility], and Self Pay  [facility] [address] [place], [place] [address] [phone] or [phone] Private Insurance Only  [facility] [address] G[address]ette Otto, [place] [address] [phone] Medicaid, Medicare, Private Insurance  [facility] at the [facility] [address] [place], [place] [address] [phone] Services are free or reduced  [facility] [address] [phone] Self Pay only, sliding scale  [facility] [address], [place] [address] [phone] Medicaid, Medicare, Private Insurance

## 2021-01-19 NOTE — ED Provider Notes (Addendum)
Emergency Medicine Observation Re-evaluation Note  Roger Robinson is a 25 y.o. male, seen on rounds today.  Pt initially presented to the ED for complaints of Suicide Attempt Currently, the patient is resting with sheet pulled over head.  Physical Exam  BP 120/77 (BP Location: Right Arm)   Pulse 80   Temp 97.9 F (36.6 C) (Oral)   Resp 17   SpO2 97%  Physical Exam General: Resting Cardiac: Normal heart rate Lungs: Normal respiratory rate Psych: Calm  ED Course / MDM  EKG:EKG Interpretation  Date/Time:  Saturday January 17 2021 01:54:20 EST Ventricular Rate:  91 PR Interval:  168 QRS Duration: 102 QT Interval:  404 QTC Calculation: 496 R Axis:   88 Text Interpretation: Normal sinus rhythm Prolonged QT Abnormal ECG NSR, QTc appears appropriate Confirmed by Coralee Pesa [phone]) on 01/18/2021 11:01:54 PM    I have reviewed the labs performed to date as well as medications administered while in observation.  Recent changes in the last 24 hours include none.  Completed treatment for hypokalemia, 2 days ago.  Plan  Current plan is for psychiatric placement.  We will recheck BMP Patient is under full IVC at this time.   Per TTS he is now appropriate for D/C. Discharge instructions given by social work. I rescinded his IVC    Mancel Bale, MD 01/19/21 1540

## 2021-01-19 NOTE — ED Notes (Signed)
Patient was given Henderson Cloud, Peanut Butter, and Ginger Ale.

## 2021-01-26 ENCOUNTER — Encounter: Payer: Self-pay | Admitting: Internal Medicine

## 2021-01-26 ENCOUNTER — Other Ambulatory Visit: Payer: Self-pay

## 2021-01-26 ENCOUNTER — Telehealth (INDEPENDENT_AMBULATORY_CARE_PROVIDER_SITE_OTHER): Payer: BC Managed Care – PPO | Admitting: Internal Medicine

## 2021-01-26 ENCOUNTER — Telehealth: Payer: Self-pay

## 2021-01-26 DIAGNOSIS — B182 Chronic viral hepatitis C: Secondary | ICD-10-CM | POA: Diagnosis not present

## 2021-01-26 NOTE — Telephone Encounter (Signed)
[facility] Patient Advocate Encounter   Received notification from [facility] that prior authorization for [facility] is required.   PA submitted on 01/26/21 Key [id number] Status is pending    [facility] will continue to follow.   Clearance Coots, CPhT Specialty Pharmacy Patient [facility] Phone: [phone] Fax:  [phone]

## 2021-01-26 NOTE — Progress Notes (Signed)
[facility]  Reason for Consult: chronic hep C Referring Provider: Elray Buba, MD from [facility]    There are no problems to display for this patient.     HPI: Roger Robinson is a 25 y.o. male ivdu, here for new hep c evaluation  01/26/21 I verified that I was speaking with the correct person using two identifiers. Due to the COVID-19 Pandemic, this service was provided via telemedicine using audio/visual media.   The patient was located at home. The provider was located in the office. The patient did consent to this visit and is aware of charges through their insurance as well as the limitations of evaluation and management by telemedicine. Other persons participating in this telemedicine service were none. Time spent on visit was greater than 20 minutes on media and in coordination of care  Patient had not yet done the elastography Reviewed labs No question I reviewed his medication and insurance allowance -- [employer] is not approved, only epclusa  background ------------ I reviewed chart/lab on 11/10/2020 from dr Lysbeth Penner of the rehab clinic Hep c Ab positive; negative acute hep a/b panel, hiv, rpr, gc/chlam lft is moderately elevated Albumin is normal There was no hep c viral load done Patient is currently on naltrexone  Hep c: Risk factors ivdu Hx indu (cocaine when he was younger) Patient was first told about this at his rehab  Patient reported he had an episode around 02/2020 where he turns yellow and flu like illness. He never was dx'ed or told about hep c prior to 11/2020 He never drinks alcohol He is doing well with current rehab program; he is not sure how long he'll take vivitrol for  Social: Patient lives alone; no relatives in [place] Patient will get back to work in a few work. Patient is a Teaching laboratory technician No etoh use Smokes cigarette; 4 years 1/2 ppd He endorses vaping  No marijuana   Cirrhosis/extraGI  manifestation Besides the episode of yellowing skin/flu like illness 02/2020 he had felt well otherwise No hx abd ascites/distension or need of paracentesis; no chronic fatigue; weight loss; hx GIB No recurrent rash, joint pain No admission kidney/lung disease otherwise  Meds: He just finished 14 days augmentin for a right face abscess Atomoxetine escitalopram Ibuprofen prn Trazodone vivitrol injection q4weeks  problems No other medical problem otherwise No hx reflux/heartburn. No consumption of any otc acid blocker  Currently he is at healthy baseline health. No f/c/weight loss, nightsweat. No n/v/diarrhea. Good appetite  Review of Systems: ROS Other ros negative     No past medical history on file.  Social History   Tobacco Use  . Smoking status: Current Every Day Smoker    Packs/day: 0.50    Years: 4.00    Pack years: 2.00    Types: Cigarettes  . Smokeless tobacco: Never Used  Vaping Use  . Vaping Use: Every day  Substance Use Topics  . Alcohol use: Not Currently  . Drug use: Not Currently    Types: IV, Cocaine, Marijuana, "Crack" cocaine, Fentanyl    Comment: last use in Nov 2021    fam hx: Unclear if any hepatitis present    OBJECTIVE: There were no vitals filed for this visit. There is no height or weight on file to calculate BMI.   Physical Exam  Phone visit  Lab: Reviewed 01/16/21 cr 0.98; lft 31/32/66/0.4; cbc 9.7/15/333  osh 11/13/20 Lipid 213/49/145/89 Cbc 7.7/14.9/307 Cr 0.8; lft  74/191/88/0.6; alb 4.9  Microbiology:  Serology: 12/2020 hep c genotype 3; 1.44million units  osh 11/13/20 rpr nr, urine gc/chlam negative hiv serology nr; rpr nr; hep c ab reactive; hep a ab nonreactive  Imaging:   Assessment/plan: #chronic hep c  Prior treatment: none GT: 3 Evidence of cirrhosis: no based on history and fib-4 score; awaiting elastography Interested in treatment  Potential DDI: none Plan 12 weeks epclusa as his insurance  wouldn't approve mavyret    -Labs: none -Imaging: elastography [place] ordered -- encourage patient to get done soon -follow up: once patient receive epclusa, he is to call us to schedule 1 month on treatment follow up -Meds planned: epclusa 12 weeks; no ddi  -discussed natural progression of hep c, transmission (avoid sharing personal hygiene equipment) -discussed avoid toxin like etoh and excessive acetamaminphen (no more than 2 gram a day) -discussed healthy life style and good glucose control -discussed avoiding eating raw sea food -discussed we can treat hep c but can be reinfected -discussed hepatitis coinfection and vaccination  #other hepatitis Patient interested in hepatitis b vaccine as well. I think this is reasonable -started hep b vaccine series today 12/16/20; he can finish series on subsequent f/u   Problem List Items Addressed This Visit   None      I am having Ndrew L. Recinos maintain his traZODone, escitalopram, atomoxetine, ibuprofen, and Vivitrol.   I spent more than 20 minute reviewing data/chart, and coordinating care with patient   Follow-up: No follow-ups on file.  Raymondo Band, MD [facility] -- -- pager   [phone] cell 01/26/2021, 11:15 AM

## 2021-01-29 ENCOUNTER — Telehealth: Payer: Self-pay

## 2021-01-29 NOTE — Telephone Encounter (Signed)
[facility] Patient Advocate Encounter  Prior Authorization for [employer] has been approved.    Effective dates: 01/28/21 through 04/22/21  Patients co-pay is $200.00.  Patient can get his prescription filled at [facility] .  I was able to get a CO-PAY [employer].   That will make the Co-Pay $0.00.   [facility] will continue to follow.  Clearance Coots, CPhT Specialty Pharmacy Patient [facility] Phone: [phone] Fax:  [phone]

## 2021-01-29 NOTE — Telephone Encounter (Signed)
Thank you :)

## 2021-03-02 ENCOUNTER — Other Ambulatory Visit: Payer: Self-pay | Admitting: Internal Medicine

## 2021-03-02 DIAGNOSIS — B182 Chronic viral hepatitis C: Secondary | ICD-10-CM

## 2021-03-02 MED ORDER — SOFOSBUVIR-VELPATASVIR 400-100 MG PO TABS: 1.0000 | ORAL_TABLET | Freq: Every day | ORAL | 2 refills | Status: DC

## 2021-03-02 MED ORDER — SOFOSBUVIR-VELPATASVIR 400-100 MG PO TABS: 1.0000 | ORAL_TABLET | Freq: Every day | ORAL | 1 refills | Status: DC

## 2021-03-02 NOTE — Addendum Note (Signed)
Addended byRutha Bouchard T on: 03/02/2021 11:15 AM   Modules accepted: Orders

## 2021-03-02 NOTE — Progress Notes (Signed)
epclusa rx for chronic hep c

## 2021-03-03 MED FILL — EPCLUSA 400 MG-100 MG TAB: 400-100 | 28 days supply | Qty: 28 | Fill #0

## 2021-03-09 ENCOUNTER — Telehealth: Payer: Self-pay
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
## 2021-03-20 ENCOUNTER — Other Ambulatory Visit (HOSPITAL_COMMUNITY): Payer: Self-pay

## 2021-03-25 ENCOUNTER — Other Ambulatory Visit (HOSPITAL_COMMUNITY): Payer: Self-pay

## 2021-03-27 ENCOUNTER — Other Ambulatory Visit (HOSPITAL_COMMUNITY): Payer: Self-pay

## 2021-03-30 ENCOUNTER — Other Ambulatory Visit (HOSPITAL_COMMUNITY): Payer: Self-pay
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
## 2021-04-06 ENCOUNTER — Other Ambulatory Visit (HOSPITAL_COMMUNITY): Payer: Self-pay

## 2021-04-07 ENCOUNTER — Telehealth: Payer: Self-pay

## 2021-04-07 NOTE — Telephone Encounter (Signed)
[facility] Patient Advocate Encounter  [facility] and I have been unsuccsessful in reaching patient to be able to refill medication.    We have tried multiple times without a response.  Gustin Zobrist, CPhT Specialty Pharmacy Patient [facility] Phone: [phone] Fax:  [phone]  

## 2021-04-10 ENCOUNTER — Telehealth: Payer: Self-pay

## 2021-04-10 NOTE — Telephone Encounter (Signed)
[facility] Patient Advocate Encounter  [facility] and I have been unsuccsessful in reaching patient to be able to refill medication.    We have tried multiple times without a response.  Mose Colaizzi, CPhT Specialty Pharmacy [facility] Phone: [phone] Fax:  [phone]  

## 2021-05-18 ENCOUNTER — Other Ambulatory Visit (HOSPITAL_COMMUNITY): Payer: Self-pay
